# Patient Record
Sex: Female | Born: 1995 | Race: Asian | Hispanic: No | Marital: Single | Smoking: Never smoker
Health system: Southern US, Community
[De-identification: ages and names within clinical notes are randomized; demographics above are authoritative.]

## PROBLEM LIST (undated history)

## (undated) DIAGNOSIS — T7840XA Allergy, unspecified, initial encounter: Secondary | ICD-10-CM

## (undated) HISTORY — DX: Allergy, unspecified, initial encounter: T78.40XA

---

## 2017-07-02 ENCOUNTER — Other Ambulatory Visit: Payer: Self-pay

## 2017-07-02 ENCOUNTER — Ambulatory Visit (INDEPENDENT_AMBULATORY_CARE_PROVIDER_SITE_OTHER): Payer: BLUE CROSS/BLUE SHIELD | Admitting: Family Medicine

## 2017-07-02 ENCOUNTER — Encounter: Payer: Self-pay | Admitting: Family Medicine

## 2017-07-02 VITALS — BP 110/68 | HR 86 | Temp 98.1°F | Ht 63.5 in | Wt 114.2 lb

## 2017-07-02 DIAGNOSIS — Z23 Encounter for immunization: Secondary | ICD-10-CM

## 2017-07-02 DIAGNOSIS — Z131 Encounter for screening for diabetes mellitus: Secondary | ICD-10-CM

## 2017-07-02 DIAGNOSIS — N926 Irregular menstruation, unspecified: Secondary | ICD-10-CM | POA: Diagnosis not present

## 2017-07-02 DIAGNOSIS — Z0000 Encounter for general adult medical examination without abnormal findings: Secondary | ICD-10-CM | POA: Diagnosis not present

## 2017-07-02 DIAGNOSIS — R233 Spontaneous ecchymoses: Secondary | ICD-10-CM

## 2017-07-02 DIAGNOSIS — R238 Other skin changes: Secondary | ICD-10-CM

## 2017-07-02 LAB — POCT URINALYSIS DIP (MANUAL ENTRY)
Bilirubin, UA: NEGATIVE
Blood, UA: NEGATIVE
Glucose, UA: NEGATIVE mg/dL
Ketones, POC UA: NEGATIVE mg/dL
Leukocytes, UA: NEGATIVE
Nitrite, UA: NEGATIVE
Protein Ur, POC: NEGATIVE mg/dL
Spec Grav, UA: >=1.03 — AB (ref 1.010–1.025)
Urobilinogen, UA: 0.2 E.U./dL
pH, UA: 5.5 (ref 5.0–8.0)

## 2017-07-02 NOTE — Patient Instructions (Addendum)
1. BMI is at the low end of normal    IF you received an x-ray today, you will receive an invoice from [facility]. Please contact [facility] at [phone] with questions or concerns regarding your invoice.   IF you received labwork today, you will receive an invoice from [facility]. Please contact [facility] at [phone] with questions or concerns regarding your invoice.   Our billing staff will not be able to assist you with questions regarding bills from these companies.  You will be contacted with the lab results as soon as they are available. The fastest way to get your results is to activate your My Chart account. Instructions are located on the last page of this paperwork. If you have not heard from [place] regarding the results in 2 weeks, please contact this office.     Preventive Care 18-39 Years, Female Preventive care refers to lifestyle choices and visits with your health care provider that can promote health and wellness. What does preventive care include?  A yearly physical exam. This is also called an annual well check.  Dental exams once or twice a year.  Routine eye exams. Ask your health care provider how often you should have your eyes checked.  Personal lifestyle choices, including: ? Daily care of your teeth and gums. ? Regular physical activity. ? Eating a healthy diet. ? Avoiding tobacco and drug use. ? Limiting alcohol use. ? Practicing safe sex. ? Taking vitamin and mineral supplements as recommended by your health care provider. What happens during an annual well check? The services and screenings done by your health care provider during your annual well check will depend on your age, overall health, lifestyle risk factors, and family history of disease. Counseling Your health care provider may ask you questions about your:  Alcohol use.  Tobacco use.  Drug use.  Emotional well-being.  Home and relationship well-being.  Sexual  activity.  Eating habits.  Work and work Statistician.  Method of birth control.  Menstrual cycle.  Pregnancy history.  Screening You may have the following tests or measurements:  Height, weight, and BMI.  Diabetes screening. This is done by checking your blood sugar (glucose) after you have not eaten for a while (fasting).  Blood pressure.  Lipid and cholesterol levels. These may be checked every 5 years starting at age 42.  Skin check.  Hepatitis C blood test.  Hepatitis B blood test.  Sexually transmitted disease (STD) testing.  BRCA-related cancer screening. This may be done if you have a family history of breast, ovarian, tubal, or peritoneal cancers.  Pelvic exam and Pap test. This may be done every 3 years starting at age 31. Starting at age 48, this may be done every 5 years if you have a Pap test in combination with an HPV test.  Discuss your test results, treatment options, and if necessary, the need for more tests with your health care provider. Vaccines Your health care provider may recommend certain vaccines, such as:  Influenza vaccine. This is recommended every year.  Tetanus, diphtheria, and acellular pertussis (Tdap, Td) vaccine. You may need a Td booster every 10 years.  Varicella vaccine. You may need this if you have not been vaccinated.  HPV vaccine. If you are 107 or younger, you may need three doses over 6 months.  Measles, mumps, and rubella (MMR) vaccine. You may need at least one dose of MMR. You may also need a second dose.  Pneumococcal 13-valent conjugate (PCV13) vaccine. You  may need this if you have certain conditions and were not previously vaccinated.  Pneumococcal polysaccharide (PPSV23) vaccine. You may need one or two doses if you smoke cigarettes or if you have certain conditions.  Meningococcal vaccine. One dose is recommended if you are age 3-21 years and a first-year college student living in a residence hall, or if you have  one of several medical conditions. You may also need additional booster doses.  Hepatitis A vaccine. You may need this if you have certain conditions or if you travel or work in places where you may be exposed to hepatitis A.  Hepatitis B vaccine. You may need this if you have certain conditions or if you travel or work in places where you may be exposed to hepatitis B.  Haemophilus influenzae type b (Hib) vaccine. You may need this if you have certain risk factors.  Talk to your health care provider about which screenings and vaccines you need and how often you need them. This information is not intended to replace advice given to you by your health care provider. Make sure you discuss any questions you have with your health care provider. Document Released: 06/26/2001 Document Revised: 01/18/2016 Document Reviewed: 03/01/2015 Elsevier Interactive Patient Education  [employer].

## 2017-07-02 NOTE — Progress Notes (Signed)
2/19/20199:01 AM  Molly CousinsNgoc Reynolds 03-Oct-1995, 22 y.o. female [id number]  Chief Complaint  Patient presents with  . Annual Exam    says she is shaving unexplained bruising on her arms and legs    HPI:   Patient is a 22 y.o. female who presents today for annual physical.  Last CPE about a year ago She goes to [employer] Lives at home with her parents Has never been sexually active Does not exercise regularly, eats traditional vietnamese diet Sees an eye doctor once a year Sees the dentist twice a year LMP 06/25/17, irregular, not heavy, occasional cramps, [facility] Immunization records reviewed, needs meningitis booster and this seasons flu vaccine, records sent for abstracting Worried about easy bruising she has noticed in her arms and legs, she denies every having clustering or bruising, nose bleeds, bleeding gums, blood in urine or stool. She denies any fhx bleeding disorder She is also worried about diabetes, denies any weight loss, polydipsia or polyuria    [facility] 07/02/2017  Decreased Interest 0  Down, Depressed, Hopeless 0  PHQ - 2 Score 0    No Known Allergies  Prior to Admission medications   Not on File    History reviewed. No pertinent past medical history.  History reviewed. No pertinent surgical history.  Social History   Tobacco Use  . Smoking status: Never Smoker  . Smokeless tobacco: Never Used  Substance Use Topics  . Alcohol use: No    Frequency: Never    Family History  Problem Relation Age of Onset  . Hypertension Mother   . Hypertension Father     Review of Systems  Constitutional: Negative for chills, diaphoresis, fever, malaise/fatigue and weight loss.  HENT: Negative for congestion, ear pain and sore throat.   Eyes: Negative for blurred vision and double vision.  Respiratory: Negative for cough, hemoptysis and shortness of breath.   Cardiovascular: Negative for chest pain, palpitations and leg  swelling.  Gastrointestinal: Negative for abdominal pain, blood in stool, melena, nausea and vomiting.  Genitourinary: Negative for dysuria and hematuria.  Musculoskeletal: Negative for joint pain and myalgias.  Neurological: Negative for dizziness and headaches.  Endo/Heme/Allergies: Negative for polydipsia. Bruises/bleeds easily.  Psychiatric/Behavioral: Negative for depression. The patient is not nervous/anxious.      OBJECTIVE:  Blood pressure 110/68, pulse 86, temperature 98.1 F (36.7 C), temperature source Oral, height 5' 3.5" (1.613 m), weight 114 lb 3.2 oz (51.8 kg), last menstrual period 06/25/2017, SpO2 99 %.  Physical Exam  Constitutional: She is oriented to person, place, and time and well-developed, well-nourished, and in no distress.  HENT:  Head: Normocephalic and atraumatic.  Right Ear: Hearing, tympanic membrane, external ear and ear canal normal.  Left Ear: Hearing, tympanic membrane, external ear and ear canal normal.  Mouth/Throat: Oropharynx is clear and moist.  Eyes: EOM are normal. Pupils are equal, round, and reactive to light.  Neck: Neck supple. No thyromegaly present.  Cardiovascular: Normal rate, regular rhythm, normal heart sounds and intact distal pulses. Exam reveals no gallop and no friction rub.  No murmur heard. Pulmonary/Chest: Effort normal and breath sounds normal. She has no wheezes. She has no rales.  Abdominal: Soft. Bowel sounds are normal. She exhibits no distension and no mass. There is no tenderness.  Musculoskeletal: Normal range of motion. She exhibits no edema.  Lymphadenopathy:    She has no cervical adenopathy.  Neurological: She is alert and oriented to person, place, and  time. She has normal reflexes. Gait normal.  Skin: Skin is warm and dry.  She has a small yellowish bruise over anterior shin of right leg, no other bruises seen  Psychiatric: Mood and affect normal.  Nursing note and vitals reviewed.   Results for orders  placed or performed in visit on 07/02/17 (from the past 24 hour(s))  POCT urinalysis dipstick     Status: Abnormal   Collection Time: 07/02/17 10:10 AM  Result Value Ref Range   Color, UA yellow yellow   Clarity, UA clear clear   Glucose, UA negative negative mg/dL   Bilirubin, UA negative negative   Ketones, POC UA negative negative mg/dL   Spec Grav, UA >=1.610 (A) 1.010 - 1.025   Blood, UA negative negative   pH, UA 5.5 5.0 - 8.0   Protein Ur, POC negative negative mg/dL   Urobilinogen, UA 0.2 0.2 or 1.0 E.U./dL   Nitrite, UA Negative Negative   Leukocytes, UA Negative Negative     ASSESSMENT and PLAN  1. Annual physical exam No concerns per history or exam. Routine HCM labs ordered. HCM reviewed/discussed. Anticipatory guidance regarding healthy weight, lifestyle and choices given.   - Basic Metabolic Panel - POCT urinalysis dipstick  2. Easy bruising - CBC with Differential - Protime-INR - APTT - Basic Metabolic Panel - POCT urinalysis dipstick  3. Irregular menses - CBC with Differential - TSH  4. Screening for diabetes mellitus (DM) - Hemoglobin A1c  5. Need for vaccination - Flu Vaccine QUAD 36+ mos IM  Other orders - Meningococcal conjugate vaccine 4-valent IM  Return in about 1 year (around 07/02/2018).    Myles Lipps, MD Primary Care at [facility] [address] [place], [place] [address] Ph.  [phone] Fax [phone]

## 2017-07-03 LAB — CBC WITH DIFFERENTIAL/PLATELET
Basophils Absolute: 0 10*3/uL (ref 0.0–0.2)
Basos: 1 %
EOS (ABSOLUTE): 0.1 10*3/uL (ref 0.0–0.4)
Eos: 1 %
Hematocrit: 40.5 % (ref 34.0–46.6)
Hemoglobin: 13.2 g/dL (ref 11.1–15.9)
Immature Grans (Abs): 0 10*3/uL (ref 0.0–0.1)
Immature Granulocytes: 0 %
Lymphocytes Absolute: 2.1 10*3/uL (ref 0.7–3.1)
Lymphs: 28 %
MCH: 29.3 pg (ref 26.6–33.0)
MCHC: 32.6 g/dL (ref 31.5–35.7)
MCV: 90 fL (ref 79–97)
Monocytes Absolute: 0.4 10*3/uL (ref 0.1–0.9)
Monocytes: 6 %
Neutrophils Absolute: 4.8 10*3/uL (ref 1.4–7.0)
Neutrophils: 64 %
Platelets: 362 10*3/uL (ref 150–379)
RBC: 4.51 x10E6/uL (ref 3.77–5.28)
RDW: 13.2 % (ref 12.3–15.4)
WBC: 7.5 10*3/uL (ref 3.4–10.8)

## 2017-07-03 LAB — HEMOGLOBIN A1C
Est. average glucose Bld gHb Est-mCnc: 100 mg/dL
Hgb A1c MFr Bld: 5.1 % (ref 4.8–5.6)

## 2017-07-03 LAB — BASIC METABOLIC PANEL
BUN/Creatinine Ratio: 15 (ref 9–23)
BUN: 9 mg/dL (ref 6–20)
CO2: 25 mmol/L (ref 20–29)
Calcium: 9.1 mg/dL (ref 8.7–10.2)
Chloride: 104 mmol/L (ref 96–106)
Creatinine, Ser: 0.61 mg/dL (ref 0.57–1.00)
GFR calc Af Amer: 152 mL/min/{1.73_m2} (ref >59)
GFR calc non Af Amer: 132 mL/min/{1.73_m2} (ref >59)
Glucose: 73 mg/dL (ref 65–99)
Potassium: 4.2 mmol/L (ref 3.5–5.2)
Sodium: 142 mmol/L (ref 134–144)

## 2017-07-03 LAB — TSH: TSH: 1.17 u[IU]/mL (ref 0.450–4.500)

## 2017-07-03 LAB — PROTIME-INR
INR: 1 (ref 0.8–1.2)
Prothrombin Time: 10.3 s (ref 9.1–12.0)

## 2017-07-03 LAB — APTT: aPTT: 27 s (ref 24–33)

## 2017-07-04 ENCOUNTER — Encounter: Payer: Self-pay | Admitting: Family Medicine

## 2017-07-09 ENCOUNTER — Ambulatory Visit: Payer: BLUE CROSS/BLUE SHIELD | Admitting: Family Medicine

## 2017-07-09 ENCOUNTER — Encounter: Payer: Self-pay | Admitting: Family Medicine

## 2017-07-09 ENCOUNTER — Other Ambulatory Visit: Payer: Self-pay

## 2017-07-09 VITALS — BP 110/60 | HR 97 | Temp 98.3°F | Ht 62.6 in | Wt 113.4 lb

## 2017-07-09 DIAGNOSIS — F411 Generalized anxiety disorder: Secondary | ICD-10-CM

## 2017-07-09 NOTE — Progress Notes (Signed)
   2/26/20198:54 AM  Molly Reynolds 08/21/1995, 22 y.o. female [id number]  Chief Complaint  Patient presents with  . Follow-up    Here to discuss lab result. Says she has excessive hunger and thirst with vertigo    HPI:   Patient is a 22 y.o. female who presents today for follow-up on labs. She states that she did not receive a letter with her results She is worried about having diabetes because she is always hungry and thristy She reports that she normally only has breakfast and dinner, normally starving by then She favors coke or pepsi over water She reports feeling more stress of recent, having problems with sleep, watches comedies on TV and speaks with family and friends, that helps  [facility] 07/09/2017 07/02/2017  Decreased Interest 0 0  Down, Depressed, Hopeless 0 0  PHQ - 2 Score 0 0    No Known Allergies  Prior to Admission medications   Not on File    History reviewed. No pertinent past medical history.  History reviewed. No pertinent surgical history.  Social History   Tobacco Use  . Smoking status: Never Smoker  . Smokeless tobacco: Never Used  Substance Use Topics  . Alcohol use: No    Frequency: Never    Family History  Problem Relation Age of Onset  . Hypertension Mother   . Hypertension Father     ROS Per hpi  OBJECTIVE:  Blood pressure 110/60, pulse 97, temperature 98.3 F (36.8 C), temperature source Oral, height 5' 2.6" (1.59 m), weight 113 lb 6.4 oz (51.4 kg), last menstrual period 06/25/2017, SpO2 96 %.  Physical Exam  Constitutional: She is oriented to person, place, and time and well-developed, well-nourished, and in no distress.  HENT:  Head: Normocephalic and atraumatic.  Mouth/Throat: Mucous membranes are normal.  Eyes: EOM are normal. Pupils are equal, round, and reactive to light. No scleral icterus.  Neck: Neck supple.  Pulmonary/Chest: Effort normal.  Neurological: She is alert and oriented to person, place,  and time. Gait normal.  Skin: Skin is warm and dry.  Psychiatric: Mood and affect normal.  Nursing note and vitals reviewed.    CBC, BMP, Alc, TSH, PTT, INR all normal ua with elevated spec grav, otherwise normal  ASSESSMENT and PLAN  1. Anxiety state Discussed healthy eating habits, importance of regular meals, proper hydration, mindfulness of stress eating, discussed exercise and relaxation techniques, patient educational handout given  Return if symptoms worsen or fail to improve.    Myles LippsIrma M Santiago, MD Primary Care at [facility] [address] [place], [place] [address] Ph.  [phone] Fax [phone]

## 2017-07-09 NOTE — Patient Instructions (Addendum)
1. Work on eating 3 meals a day with maybe 1-2 snack depending on time intervals between meals 2. Aim to get in 30-45 minutes of exercise (walking is a good way to start) 4-5 days a week 3. Practice relaxation techniques before bedtime    IF you received an x-ray today, you will receive an invoice from [facility]. Please contact [facility] at [phone] with questions or concerns regarding your invoice.   IF you received labwork today, you will receive an invoice from [facility]. Please contact [facility] at [phone] with questions or concerns regarding your invoice.   Our billing staff will not be able to assist you with questions regarding bills from these companies.  You will be contacted with the lab results as soon as they are available. The fastest way to get your results is to activate your My Chart account. Instructions are located on the last page of this paperwork. If you have not heard from us regarding the results in 2 weeks, please contact this office.

## 2017-08-22 ENCOUNTER — Encounter: Payer: Self-pay | Admitting: *Deleted

## 2017-10-15 ENCOUNTER — Encounter: Payer: Self-pay | Admitting: Physician Assistant

## 2017-10-15 ENCOUNTER — Ambulatory Visit (INDEPENDENT_AMBULATORY_CARE_PROVIDER_SITE_OTHER): Payer: BLUE CROSS/BLUE SHIELD | Admitting: Physician Assistant

## 2017-10-15 ENCOUNTER — Ambulatory Visit (INDEPENDENT_AMBULATORY_CARE_PROVIDER_SITE_OTHER): Payer: Self-pay

## 2017-10-15 VITALS — BP 117/77 | HR 82 | Temp 98.1°F | Resp 17 | Ht 62.5 in | Wt 131.0 lb

## 2017-10-15 DIAGNOSIS — S92354A Nondisplaced fracture of fifth metatarsal bone, right foot, initial encounter for closed fracture: Secondary | ICD-10-CM

## 2017-10-15 DIAGNOSIS — M25571 Pain in right ankle and joints of right foot: Secondary | ICD-10-CM

## 2017-10-15 NOTE — Progress Notes (Signed)
   Molly Reynolds  MRN: [id number] DOB: 05-25-1995  PCP: Patient, No Pcp Per  Subjective:  Pt is a pleasant 22 year old female who presents to clinic for right foot pain x 3 days. She twisted her foot to the outside. Since that time she endorses pain, swelling and bruising of the bottom part of her foot. She can walk, but she uses the outside of her heel. Pain is 3 out of 10.  She has been icing and elevating her foot.  No h/o foot injury.   ROS below.  She works [employer].   Review of Systems  Musculoskeletal: Positive for arthralgias (right foot) and gait problem.  Skin: Positive for color change (bruising).  Neurological: Negative for weakness and numbness.    There are no active problems to display for this patient.   No current outpatient medications on file prior to visit.   No current facility-administered medications on file prior to visit.     No Known Allergies   Objective:  BP 117/77   Pulse 82   Temp 98.1 F (36.7 C) (Oral)   Resp 17   Ht 5' 2.5" (1.588 m)   Wt 131 lb (59.4 kg)   LMP 10/09/2017 (Approximate)   SpO2 98%   BMI 23.58 kg/m   Physical Exam  Constitutional: She is oriented to person, place, and time. No distress.  Musculoskeletal:       Right foot: There is tenderness and bony tenderness. There is normal range of motion.       Feet:  Neurological: She is alert and oriented to person, place, and time.  Skin: Skin is warm and dry.  Psychiatric: Judgment normal.  Vitals reviewed.  Dg Foot Complete Right  Result Date: 10/15/2017 CLINICAL DATA:  Pain following fall EXAM: RIGHT FOOT COMPLETE - 3+ VIEW COMPARISON:  None. FINDINGS: Frontal, oblique, and lateral views were obtained. There is a fracture extending through portions of the mid the distal aspects of the fifth metatarsal with alignment overall near anatomic. There is soft tissue swelling in this area laterally. No other fracture. No dislocation. Joint spaces appear normal. No erosive  change. IMPRESSION: Fracture involving mid the distal aspects of the fifth metatarsal with alignment near anatomic. No other fracture. No dislocation. No appreciable arthropathy. These results will be called to the ordering clinician or representative by the Radiologist Assistant, and communication documented in the PACS or zVision Dashboard. Electronically Signed   By: Bretta BangWilliam  Woodruff III M.D.   On: 10/15/2017 12:15    Assessment and Plan :  1. Closed nondisplaced fracture of fifth metatarsal bone of right foot, initial encounter 2. Pain in joint involving right ankle and foot - DG Foot Complete Right; Future - Ambulatory referral to Orthopedic Surgery - pt presets with 3 days right foot pain following eversion injury. X-ray shows fracture of distal 5th metatarsal. Pt placed in short walking boot. Referral to ortho for evaluation and management. NSAIDs as needed for pain. con't ice and elevation.  She agrees with plan.    Marco CollieWhitney Mahkayla Preece, PA-C  Primary Care at [facility] 10/15/2017 11:48 AM

## 2017-10-15 NOTE — Patient Instructions (Addendum)
You have a fracture of your foot.  Please wear the boot whenever you are up and about. You do not have to wear this while not walking. If it feels better to sleep with the boot on, you may do so.  Use crutches if you need them.  Put ice in a plastic bag. Place a towel between your skin and the bag or between your plaster splint and the bag. Leave the ice on for 20 minutes, 2-3 times a day. Ibuprofen and/or Tylenol for pain. Keep the area elevated above the level of your heart. Do this 2-3 times a day until swelling improves.   You will receive a phone call to schedule an appointment with orthopedics. Please be sure we have your correct phone number, answer calls from unknown numbers and check your voicemail.   Come back and see me as needed.    Metatarsal Fracture A metatarsal fracture is a broken bone in one of the five bones that connect your toes to the rest of your foot (forefoot fracture). Metatarsals are long bones that can be stressed or cracked easily. A metatarsal fracture can be:  A stress fracture. Stress fractures are cracks in the surface of the metatarsal bone. Athletes often get stress fractures.  A complete fracture. A complete fracture goes all the way through the bone.  The bone that connects to the pinky toe (fifth metatarsal) is the most commonly fractured metatarsal. Ballet dancers often fracture this bone. What are the causes? This type of fracture may be caused by:  A sudden twisting of your foot.  A fall onto your foot.  Overuse or repetitive exercise.  What increases the risk? This condition is more likely to develop in people who:  Play contact sports.  Have a bone disease.  Have a low calcium level.  What are the signs or symptoms? Symptoms of this condition include:  Pain that is worse when walking or standing.  Pain when pressing on the foot or moving the toes.  Swelling.  Bruising on the top or bottom of the foot.  A foot that appears  shorter than the other one.  How is this diagnosed? This condition is diagnosed with a physical exam. You may also have imaging tests, such as:  X-rays.  A CT scan.  MRI.  How is this treated? Treatment for this condition depends on its severity and whether a bone has moved out of place. Treatment may involve:  Rest.  Wearing foot support such as a cast, splint, or boot for several weeks.  Using crutches.  Surgery to move bones back into the right position. Surgery is usually needed if there are many pieces of broken bone or bones that are very out of place (displaced fracture).  Physical therapy. This may be needed to help you regain full movement and strength in your foot.  You will need to return to your health care provider to have X-rays taken until your bones heal. Your health care provider will look at the X-rays to make sure that your foot is healing well. Follow these instructions at home: If you have a cast:  Do not stick anything inside the cast to scratch your skin. Doing that increases your risk of infection.  Check the skin around the cast every day. Report any concerns to your health care provider. You may put lotion on dry skin around the edges of the cast. Do not apply lotion to the skin underneath the cast.  Keep the cast  clean and dry. If you have a splint or a supportive boot:  Wear it as directed by your health care provider. Remove it only as directed by your health care provider.  Loosen it if your toes become numb and tingle, or if they turn cold and blue.  Keep it clean and dry. Bathing  Do not take baths, swim, or use a hot tub until your health care provider approves. Ask your health care provider if you can take showers. You may only be allowed to take sponge baths for bathing.  If your health care provider approves bathing and showering, cover the cast or splint with a watertight plastic bag to protect it from water. Do not let the cast or  splint get wet. Managing pain, stiffness, and swelling  If directed, apply ice to the injured area (if you have a splint, not a cast). ? Put ice in a plastic bag. ? Place a towel between your skin and the bag. ? Leave the ice on for 20 minutes, 2-3 times per day.  Move your toes often to avoid stiffness and to lessen swelling.  Raise (elevate) the injured area above the level of your heart while you are sitting or lying down. Driving  Do not drive or operate heavy machinery while taking pain medicine.  Do not drive while wearing foot support on a foot that you use for driving. Activity  Return to your normal activities as directed by your health care provider. Ask your health care provider what activities are safe for you.  Perform exercises as directed by your health care provider or physical therapist. Safety  Do not use the injured foot to support your body weight until your health care provider says that you can. Use crutches as directed by your health care provider. General instructions  Do not put pressure on any part of the cast or splint until it is fully hardened. This may take several hours.  Do not use any tobacco products, including cigarettes, chewing tobacco, or e-cigarettes. Tobacco can delay bone healing. If you need help quitting, ask your health care provider.  Take medicines only as directed by your health care provider.  Keep all follow-up visits as directed by your health care provider. This is important. Contact a health care provider if:  You have a fever.  Your cast, splint, or boot is too loose or too tight.  Your cast, splint, or boot is damaged.  Your pain medicine is not helping.  You have pain, tingling, or numbness in your foot that is not going away. Get help right away if:  You have severe pain.  You have tingling or numbness in your foot that is getting worse.  Your foot feels cold or becomes numb.  Your foot changes color. This  information is not intended to replace advice given to you by your health care provider. Make sure you discuss any questions you have with your health care provider. Document Released: 01/20/2002 Document Revised: 01/03/2016 Document Reviewed: 02/24/2014 Elsevier Interactive Patient Education  [employer].  Thank you for coming in today. I hope you feel we met your needs.  Feel free to call PCP if you have any questions or further requests.  Please consider signing up for MyChart if you do not already have it, as this is a great way to communicate with me.  Best,  Whitney McVey, PA-C  IF you received an x-ray today, you will receive an invoice from [facility]. Please contact  [facility] at [phone] with questions or concerns regarding your invoice.   IF you received labwork today, you will receive an invoice from [facility]. Please contact [facility] at [phone] with questions or concerns regarding your invoice.   Our billing staff will not be able to assist you with questions regarding bills from these companies.  You will be contacted with the lab results as soon as they are available. The fastest way to get your results is to activate your My Chart account. Instructions are located on the last page of this paperwork. If you have not heard from [place] regarding the results in 2 weeks, please contact this office.

## 2017-10-16 ENCOUNTER — Telehealth: Payer: Self-pay | Admitting: Urgent Care

## 2017-10-16 NOTE — Telephone Encounter (Signed)
Copied from CRM #[id number]. Topic: Quick Communication - See Telephone Encounter °>> Oct 15, 2017  2:07 PM McGee, Demi B, NT wrote: °CRM for notification. See Telephone encounter for: 10/15/17. °Patient calling and states that she called her insurance and they told her that it was active. Patient states that she was told at her visit today that it was not active. Please advise. CB#: [phone] °>> Oct 15, 2017  2:10 PM Lander, Lumin L wrote: °New [employer]  Insurance °Policy#[id number] °Group#[id number] °

## 2017-10-19 ENCOUNTER — Telehealth: Payer: Self-pay | Admitting: Family Medicine

## 2017-10-19 NOTE — Telephone Encounter (Signed)
Copied from CRM [id number]. Topic: Quick Communication - See Telephone Encounter >> Oct 15, 2017  2:07 PM Lorrine KinMcGee, Demi B, NT wrote: CRM for notification. See Telephone encounter for: 10/15/17. Patient calling and states that she called her insurance and they told her that it was active. Patient states that she was told at her visit today that it was not active. Please advise. CB#: [phone] >> Oct 15, 2017  2:10 PM Debroah LoopLander, Lumin L wrote: [employer]  [employer] [phone]#[id number] Group#[id number]

## 2019-07-23 ENCOUNTER — Ambulatory Visit: Payer: Self-pay | Attending: Internal Medicine

## 2019-07-23 DIAGNOSIS — Z23 Encounter for immunization: Secondary | ICD-10-CM

## 2019-07-23 NOTE — Progress Notes (Signed)
   Covid-19 Vaccination Clinic  Name:  Molly Reynolds    MRN: [id number] DOB: [birth year]  07/23/2019  [name] was observed post Covid-19 immunization for 15 minutes without incident. She was provided with Vaccine Information Sheet and instruction to access the V-Safe system.   [name] was instructed to call 911 with any severe reactions post vaccine: Marland Kitchen Difficulty breathing  . Swelling of face and throat  . A fast heartbeat  . A bad rash all over body  . Dizziness and weakness   Immunizations Administered    Name Date Dose VIS Date Route   Pfizer COVID-19 Vaccine 07/23/2019  9:14 AM 0.3 mL 04/24/2019 Intramuscular   Manufacturer: [employer], [employer]   Lot: [id number]   NDC: [address]-8      Covid-19 Vaccination Clinic  Name:  Molly Reynolds    MRN: [id number] DOB: September 10, 1995  07/23/2019  [name] was observed post Covid-19 immunization for 15 minutes without incident. She was provided with Vaccine Information Sheet and instruction to access the V-Safe system.   [name] was instructed to call 911 with any severe reactions post vaccine: Marland Kitchen Difficulty breathing  . Swelling of face and throat  . A fast heartbeat  . A bad rash all over body  . Dizziness and weakness   Immunizations Administered    Name Date Dose VIS Date Route   Pfizer COVID-19 Vaccine 07/23/2019  9:14 AM 0.3 mL 04/24/2019 Intramuscular   Manufacturer: [employer], [employer]   Lot: [id number]   NDC: [address]-7

## 2019-08-17 ENCOUNTER — Ambulatory Visit: Payer: Self-pay | Attending: Internal Medicine

## 2019-08-17 DIAGNOSIS — Z23 Encounter for immunization: Secondary | ICD-10-CM

## 2019-08-17 NOTE — Progress Notes (Signed)
   Covid-19 Vaccination Clinic  Name:  Molly Reynolds    MRN: [id number] DOB: 07-Apr-1996  08/17/2019  Molly Reynolds was observed post Covid-19 immunization for 15 minutes without incident. She was provided with Vaccine Information Sheet and instruction to access the V-Safe system.   Molly Reynolds was instructed to call 911 with any severe reactions post vaccine: Marland Kitchen Difficulty breathing  . Swelling of face and throat  . A fast heartbeat  . A bad rash all over body  . Dizziness and weakness   Immunizations Administered    Name Date Dose VIS Date Route   Pfizer COVID-19 Vaccine 08/17/2019  9:17 AM 0.3 mL 04/24/2019 Intramuscular   Manufacturer: [employer], [employer]   Lot: [id number]   NDC: [address]-3

## 2019-12-31 IMAGING — DX DG FOOT COMPLETE 3+V*R*
3 series · 3 of 3 positions shown · non-contrast
Comparison: None.

CLINICAL DATA: Pain following fall

EXAM:
RIGHT FOOT COMPLETE - 3+ VIEW

[foot ap]
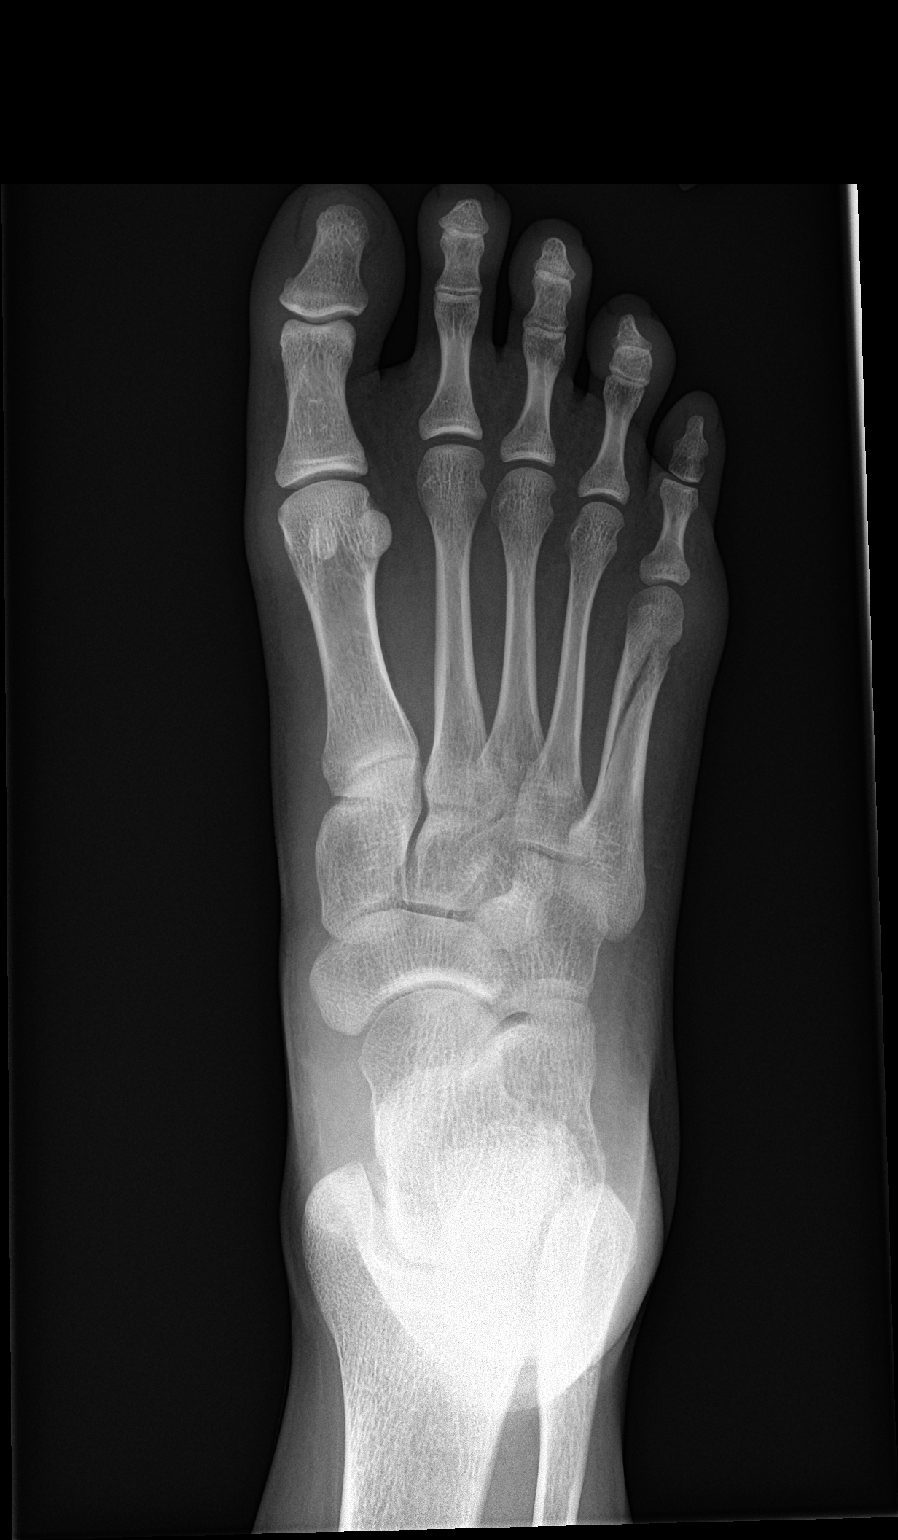

[foot obl]
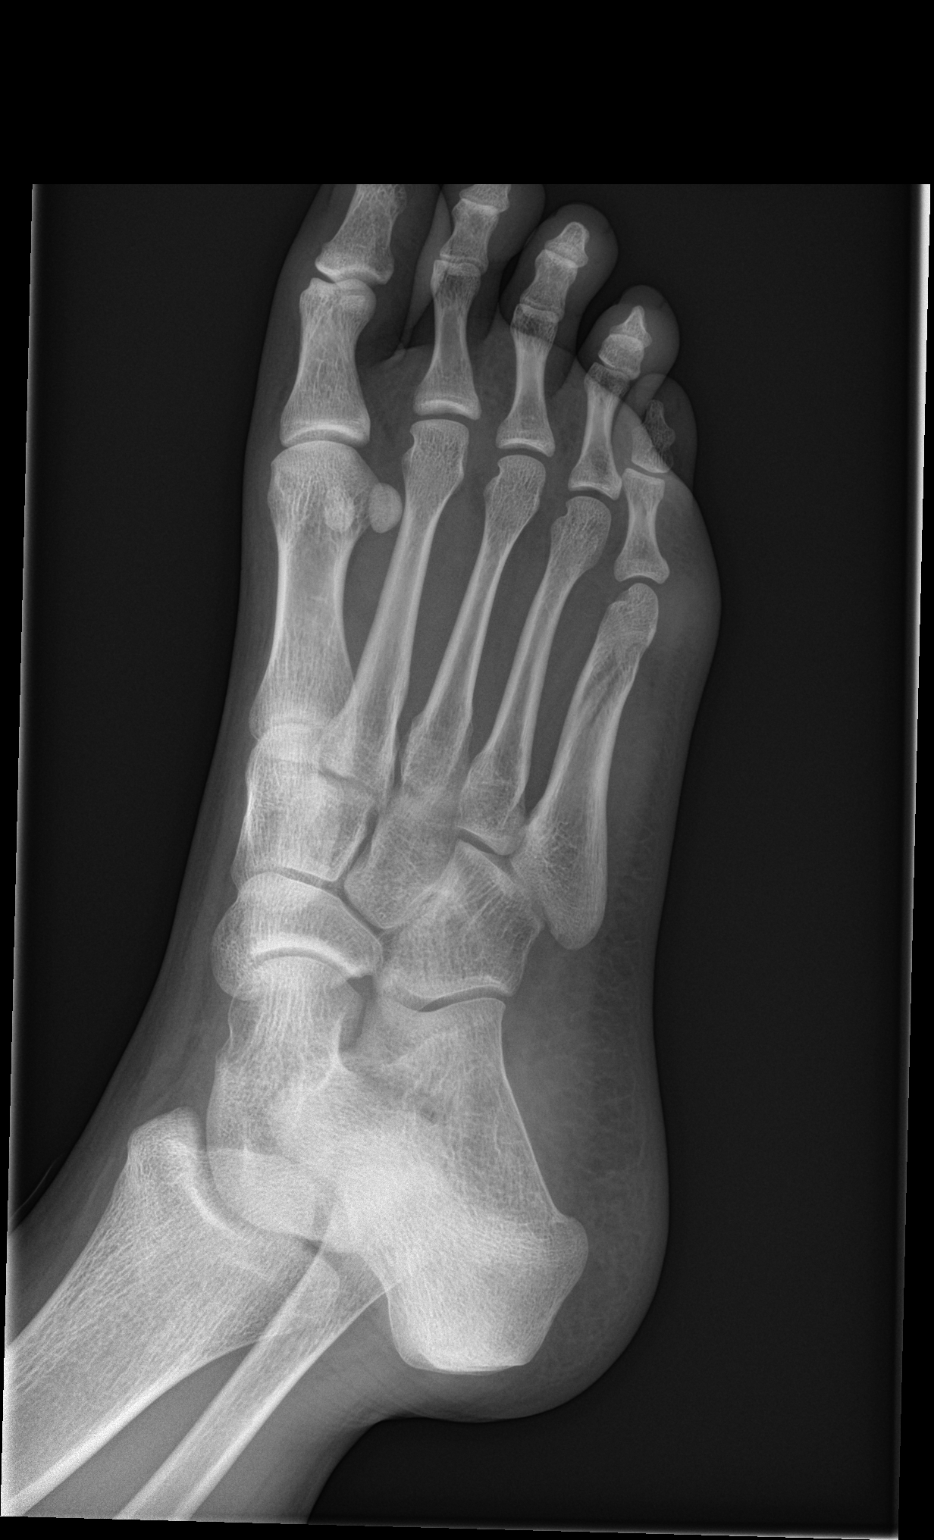

[foot lat]
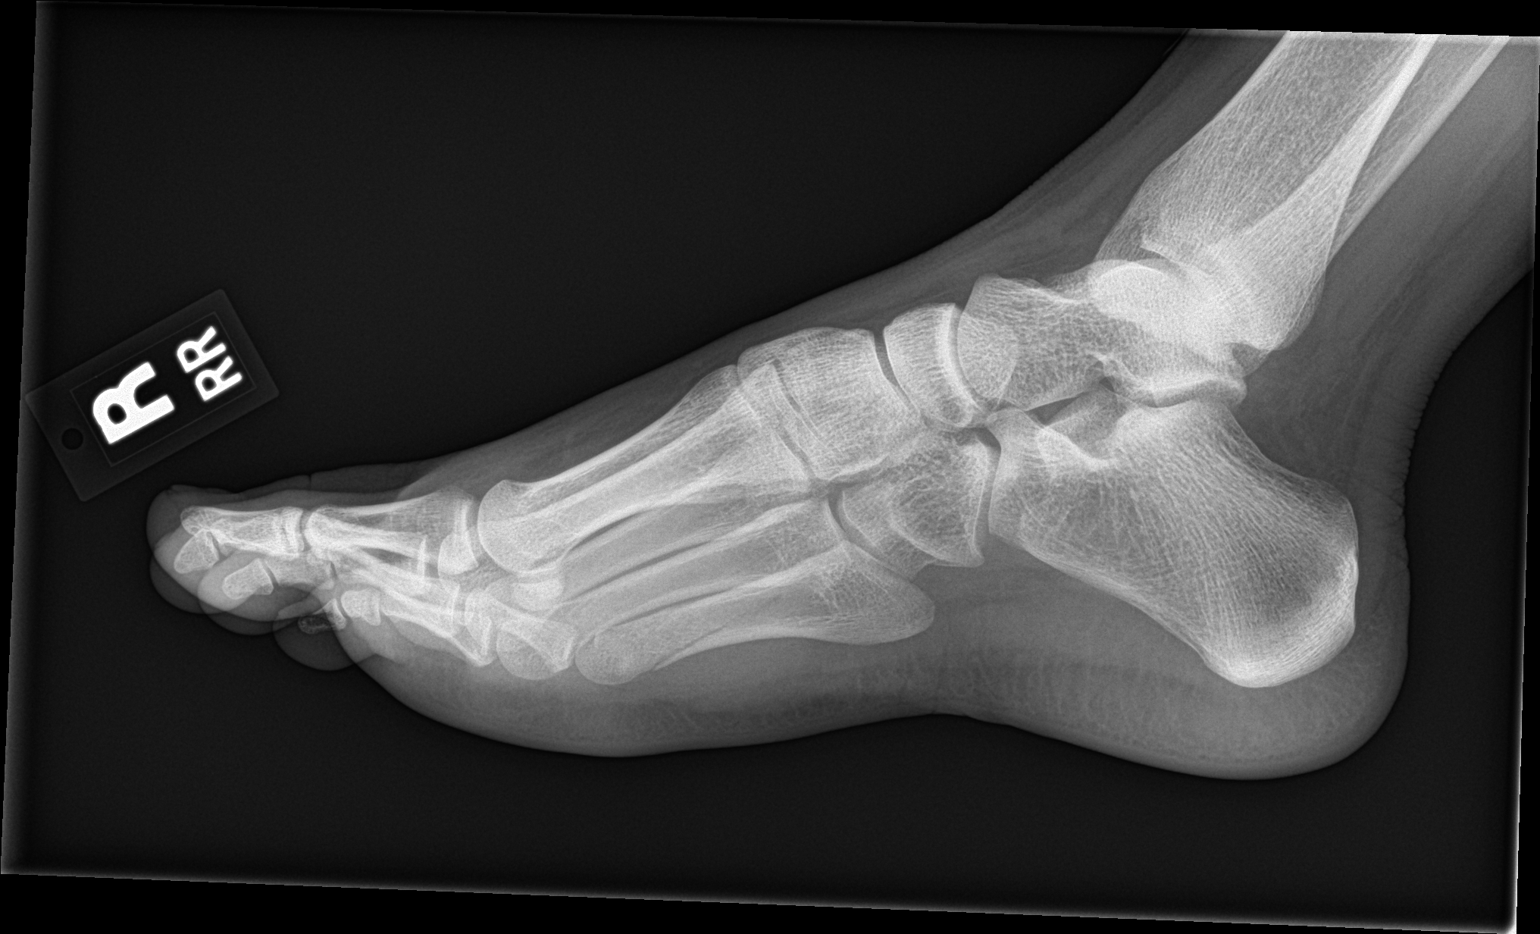

[3 of 3 positions shown; findings below may reference images not displayed]

FINDINGS: Frontal, oblique, and lateral views were obtained. There is a
fracture extending through portions of the mid the distal aspects of
the fifth metatarsal with alignment overall near anatomic. There is
soft tissue swelling in this area laterally. No other fracture. No
dislocation. Joint spaces appear normal. No erosive change.
IMPRESSION: Fracture involving mid the distal aspects of the fifth metatarsal
with alignment near anatomic. No other fracture. No dislocation. No
appreciable arthropathy.

These results will be called to the ordering clinician or
representative by the Radiologist Assistant, and communication
documented in the PACS or zVision Dashboard.

## 2020-08-15 ENCOUNTER — Encounter: Payer: Self-pay | Admitting: Family Medicine

## 2020-11-08 ENCOUNTER — Ambulatory Visit: Payer: Self-pay | Admitting: Internal Medicine

## 2020-11-08 DIAGNOSIS — Z0289 Encounter for other administrative examinations: Secondary | ICD-10-CM
